# Patient Record
Sex: Female | Born: 2000 | Race: White | Hispanic: No | Marital: Single | Smoking: Never smoker
Health system: Southern US, Community
[De-identification: ages and names within clinical notes are randomized; demographics above are authoritative.]

---

## 2020-03-27 ENCOUNTER — Encounter (HOSPITAL_COMMUNITY): Payer: Self-pay

## 2020-03-27 ENCOUNTER — Other Ambulatory Visit: Payer: Self-pay

## 2020-03-27 ENCOUNTER — Emergency Department (HOSPITAL_COMMUNITY)
Admission: EM | Admit: 2020-03-27 | Discharge: 2020-03-28 | Disposition: A | Discharge status: Home / Self Care | Payer: BC Managed Care – PPO | Attending: Emergency Medicine | Admitting: Emergency Medicine

## 2020-03-27 ENCOUNTER — Emergency Department (HOSPITAL_COMMUNITY): Payer: BC Managed Care – PPO

## 2020-03-27 DIAGNOSIS — R000 Tachycardia, unspecified: Secondary | ICD-10-CM

## 2020-03-27 DIAGNOSIS — R110 Nausea: Secondary | ICD-10-CM | POA: Insufficient documentation

## 2020-03-27 DIAGNOSIS — R0602 Shortness of breath: Secondary | ICD-10-CM | POA: Diagnosis not present

## 2020-03-27 DIAGNOSIS — R079 Chest pain, unspecified: Secondary | ICD-10-CM | POA: Diagnosis not present

## 2020-03-27 LAB — I-STAT BETA HCG BLOOD, ED (MC, WL, AP ONLY): I-stat hCG, quantitative: <5 m[IU]/mL (ref <5)

## 2020-03-27 LAB — BASIC METABOLIC PANEL
Anion gap: 13 (ref 5–15)
BUN: 9 mg/dL (ref 6–20)
CO2: 23 mmol/L (ref 22–32)
Calcium: 9.7 mg/dL (ref 8.9–10.3)
Chloride: 101 mmol/L (ref 98–111)
Creatinine, Ser: 0.74 mg/dL (ref 0.44–1.00)
GFR, Estimated: >60 mL/min (ref >60)
Glucose, Bld: 118 mg/dL — ABNORMAL HIGH (ref 70–99)
Potassium: 3.8 mmol/L (ref 3.5–5.1)
Sodium: 137 mmol/L (ref 135–145)

## 2020-03-27 LAB — CBC
HCT: 42.6 % (ref 36.0–46.0)
Hemoglobin: 13.9 g/dL (ref 12.0–15.0)
MCH: 28.3 pg (ref 26.0–34.0)
MCHC: 32.6 g/dL (ref 30.0–36.0)
MCV: 86.8 fL (ref 80.0–100.0)
Platelets: 410 10*3/uL — ABNORMAL HIGH (ref 150–400)
RBC: 4.91 MIL/uL (ref 3.87–5.11)
RDW: 13.2 % (ref 11.5–15.5)
WBC: 9.3 10*3/uL (ref 4.0–10.5)
nRBC: 0 % (ref 0.0–0.2)

## 2020-03-27 LAB — TROPONIN I (HIGH SENSITIVITY): Troponin I (High Sensitivity): 3 ng/L (ref <18)

## 2020-03-27 NOTE — ED Triage Notes (Signed)
Patient with sudden onset upper chest pain now radiating into her neck, patient reports racing heart feeling and ringing in her ears

## 2020-03-28 LAB — TROPONIN I (HIGH SENSITIVITY): Troponin I (High Sensitivity): 3 ng/L (ref <18)

## 2020-03-28 LAB — D-DIMER, QUANTITATIVE: D-Dimer, Quant: <0.27 ug{FEU}/mL (ref 0.00–0.50)

## 2020-03-28 NOTE — ED Provider Notes (Signed)
[facility] EMERGENCY DEPARTMENT Provider Note  CSN: [id number] Arrival date & time: 03/27/20 2209  Chief Complaint(s) Chest Pain  HPI Jordan Owens is a 20 y.o. female here with sudden onset left upper chest pain that radiates to her neck. Sudden onset and severe.  Now resolved. Nonradiating and nonexertional. Associated with shortness of breath, nausea and left facial numbness as well as ringing in the ear. Patient also noted heart racing heart rate. Reports prior history of panic attacks but states this is completely different. Her symptoms lasted approximately 1-1/2 hours and resolved after arriving here.  Now completely asymptomatic. She denies any recent fevers or infections. No coughing or congestion. She denies any illicit drug use or alcohol use. No energy drinks. Patient does take estrogen-based OCPs. No prior history of blood clots or DVT.  HPI  Past Medical History History reviewed. No pertinent past medical history. There are no problems to display for this patient.  Home Medication(s) Prior to Admission medications   Medication Sig Start Date End Date Taking? Authorizing Provider  TRI-SPRINTEC 0.18/0.215/0.25 MG-35 MCG tablet Take 1 tablet by mouth daily. 01/23/20  Yes [provider]                                                                                                                                    Past Surgical History History reviewed. No pertinent surgical history. Family History History reviewed. No pertinent family history.  Social History Social History   Tobacco Use  . Smoking status: Never Smoker  . Smokeless tobacco: Never Used  Substance Use Topics  . Alcohol use: Yes  . Drug use: Never   Allergies Patient has no known allergies.  Review of Systems Review of Systems All other systems are reviewed and are negative for acute change except as noted in the HPI  Physical Exam Vital Signs  I  have reviewed the triage vital signs BP 124/79   Pulse 83   Temp 98.2 F (36.8 C) (Oral)   Resp 18   Ht 5\' 6"  (1.676 m)   Wt 63.5 kg   LMP 03/24/2020   SpO2 98%   BMI 22.60 kg/m   Physical Exam Vitals reviewed.  Constitutional:      General: She is not in acute distress.    Appearance: She is well-developed and well-nourished. She is not diaphoretic.  HENT:     Head: Normocephalic and atraumatic.     Nose: Nose normal.  Eyes:     General: No scleral icterus.       Right eye: No discharge.        Left eye: No discharge.     Extraocular Movements: EOM normal.     Conjunctiva/sclera: Conjunctivae normal.     Pupils: Pupils are equal, round, and reactive to light.  Cardiovascular:     Rate and Rhythm: Normal rate and regular rhythm.     Heart  sounds: No murmur heard. No friction rub. No gallop.   Pulmonary:     Effort: Pulmonary effort is normal. No respiratory distress.     Breath sounds: Normal breath sounds. No stridor. No rales.  Abdominal:     General: There is no distension.     Palpations: Abdomen is soft.     Tenderness: There is no abdominal tenderness.  Musculoskeletal:        General: No tenderness or edema.     Cervical back: Normal range of motion and neck supple. No muscular tenderness.  Skin:    General: Skin is warm and dry.     Findings: No erythema or rash.  Neurological:     Mental Status: She is alert and oriented to person, place, and time.  Psychiatric:        Mood and Affect: Mood and affect normal.     ED Results and Treatments Labs (all labs ordered are listed, but only abnormal results are displayed) Labs Reviewed  BASIC METABOLIC PANEL - Abnormal; Notable for the following components:      Result Value   Glucose, Bld 118 (*)    All other components within normal limits  CBC - Abnormal; Notable for the following components:   Platelets 410 (*)    All other components within normal limits  D-DIMER, QUANTITATIVE  I-STAT BETA HCG  BLOOD, ED ([facility], [facility], [facility] ONLY)  TROPONIN I (HIGH SENSITIVITY)  TROPONIN I (HIGH SENSITIVITY)                                                                                                                         EKG  EKG Interpretation  Date/Time:  Wednesday March 27 2020 22:20:40 EST Ventricular Rate:  147 PR Interval:    QRS Duration: 72 QT Interval:  338 QTC Calculation: 528 R Axis:   94 Text Interpretation: Sinus tachycardia with short PR  vs fluttler vs SVT Rightward axis ST & T wave abnormality, consider inferolateral ischemia Abnormal ECG No old tracing to compare Reconfirmed by Drema Pry [phone]) on 03/28/2020 3:50:07 AM       EKG Interpretation  Date/Time:  Thursday March 28 2020 03:17:11 EST Ventricular Rate:  62 PR Interval:    QRS Duration: 84 QT Interval:  426 QTC Calculation: 433 R Axis:   84 Text Interpretation: Sinus rhythm ST elev, probable normal early repol pattern Since last tracing rate slower Confirmed by Drema Pry [phone]) on 03/28/2020 3:50:48 AM       Radiology DG Chest 2 View  Result Date: 03/27/2020 CLINICAL DATA:  Sudden onset of upper chest pain radiating to the neck. EXAM: CHEST - 2 VIEW COMPARISON:  None. FINDINGS: Heart size is normal. Mediastinal shadows are normal. The lungs are clear. No bronchial thickening. No infiltrate, mass, effusion or collapse. Pulmonary vascularity is normal. No bony abnormality. IMPRESSION: Normal chest. Electronically Signed   By: Paulina Fusi M.D.   On: 03/27/2020 22:51    Pertinent labs &  imaging results that were available during my care of the patient were reviewed by me and considered in my medical decision making (see chart for details).  Medications Ordered in ED Medications - No data to display                                                                                                                                  Procedures Procedures  (including critical care time)  Medical Decision  Making / ED Course I have reviewed the nursing notes for this encounter and the patient's prior records (if available in EHR or on provided paperwork).   Jordan Owens was evaluated in Emergency Department on 03/28/2020 for the symptoms described in the history of present illness. She was evaluated in the context of the global COVID-19 pandemic, which necessitated consideration that the patient might be at risk for infection with the SARS-CoV-2 virus that causes COVID-19. Institutional protocols and algorithms that pertain to the evaluation of patients at risk for COVID-19 are in a state of rapid change based on information released by regulatory bodies including the CDC and federal and state organizations. These policies and algorithms were followed during the patient's care in the ED.  Atypical chest pain. Initial EKG with tachycardia, sinus vs, flutter vs SVT. Serial troponins negative.  Doubt ACS.  Patient is on OCPs and had tachycardia.  Possible PE, low to intermediate probability.  Dimer negative.  Given the fact that her tachycardia resolved she is asymptomatic this is highly unlikely.  Possible SVT. Now resolved. No significant electrolyte derangements or renal sufficiency. No anemia.  Chest x-ray without evidence suggestive of pneumonia, pneumothorax, pneumomediastinum.  No abnormal contour of the mediastinum to suggest dissection. No evidence of acute injuries. Low suspicion for dissection.      Final Clinical Impression(s) / ED Diagnoses Final diagnoses:  Left-sided chest pain  Tachycardia  SOB (shortness of breath)   The patient appears reasonably screened and/or stabilized for discharge and I doubt any other medical condition or other [facility] requiring further screening, evaluation, or treatment in the ED at this time prior to discharge. Safe for discharge with strict return precautions.  Disposition: Discharge  Condition: Good  I have discussed the results, Dx and Tx  plan with the patient/family who expressed understanding and agree(s) with the plan. Discharge instructions discussed at length. The patient/family was given strict return precautions who verbalized understanding of the instructions. No further questions at time of discharge.    ED Discharge Orders    None       Follow Up: Paula Libra, MD [address] [facility] [address] [place] [address] [phone]  Call  to schedule an appointment for close follow up      This chart was dictated using voice recognition software.  Despite best efforts to proofread,  errors can occur which can change the documentation meaning.   Nira Conn, MD 03/28/20 [phone]

## 2021-08-10 IMAGING — CR DG CHEST 2V
2 series · 2 of 2 positions shown · non-contrast
Comparison: None.

CLINICAL DATA: Sudden onset of upper chest pain radiating to the
neck.

EXAM:
CHEST - 2 VIEW

[chest pa]
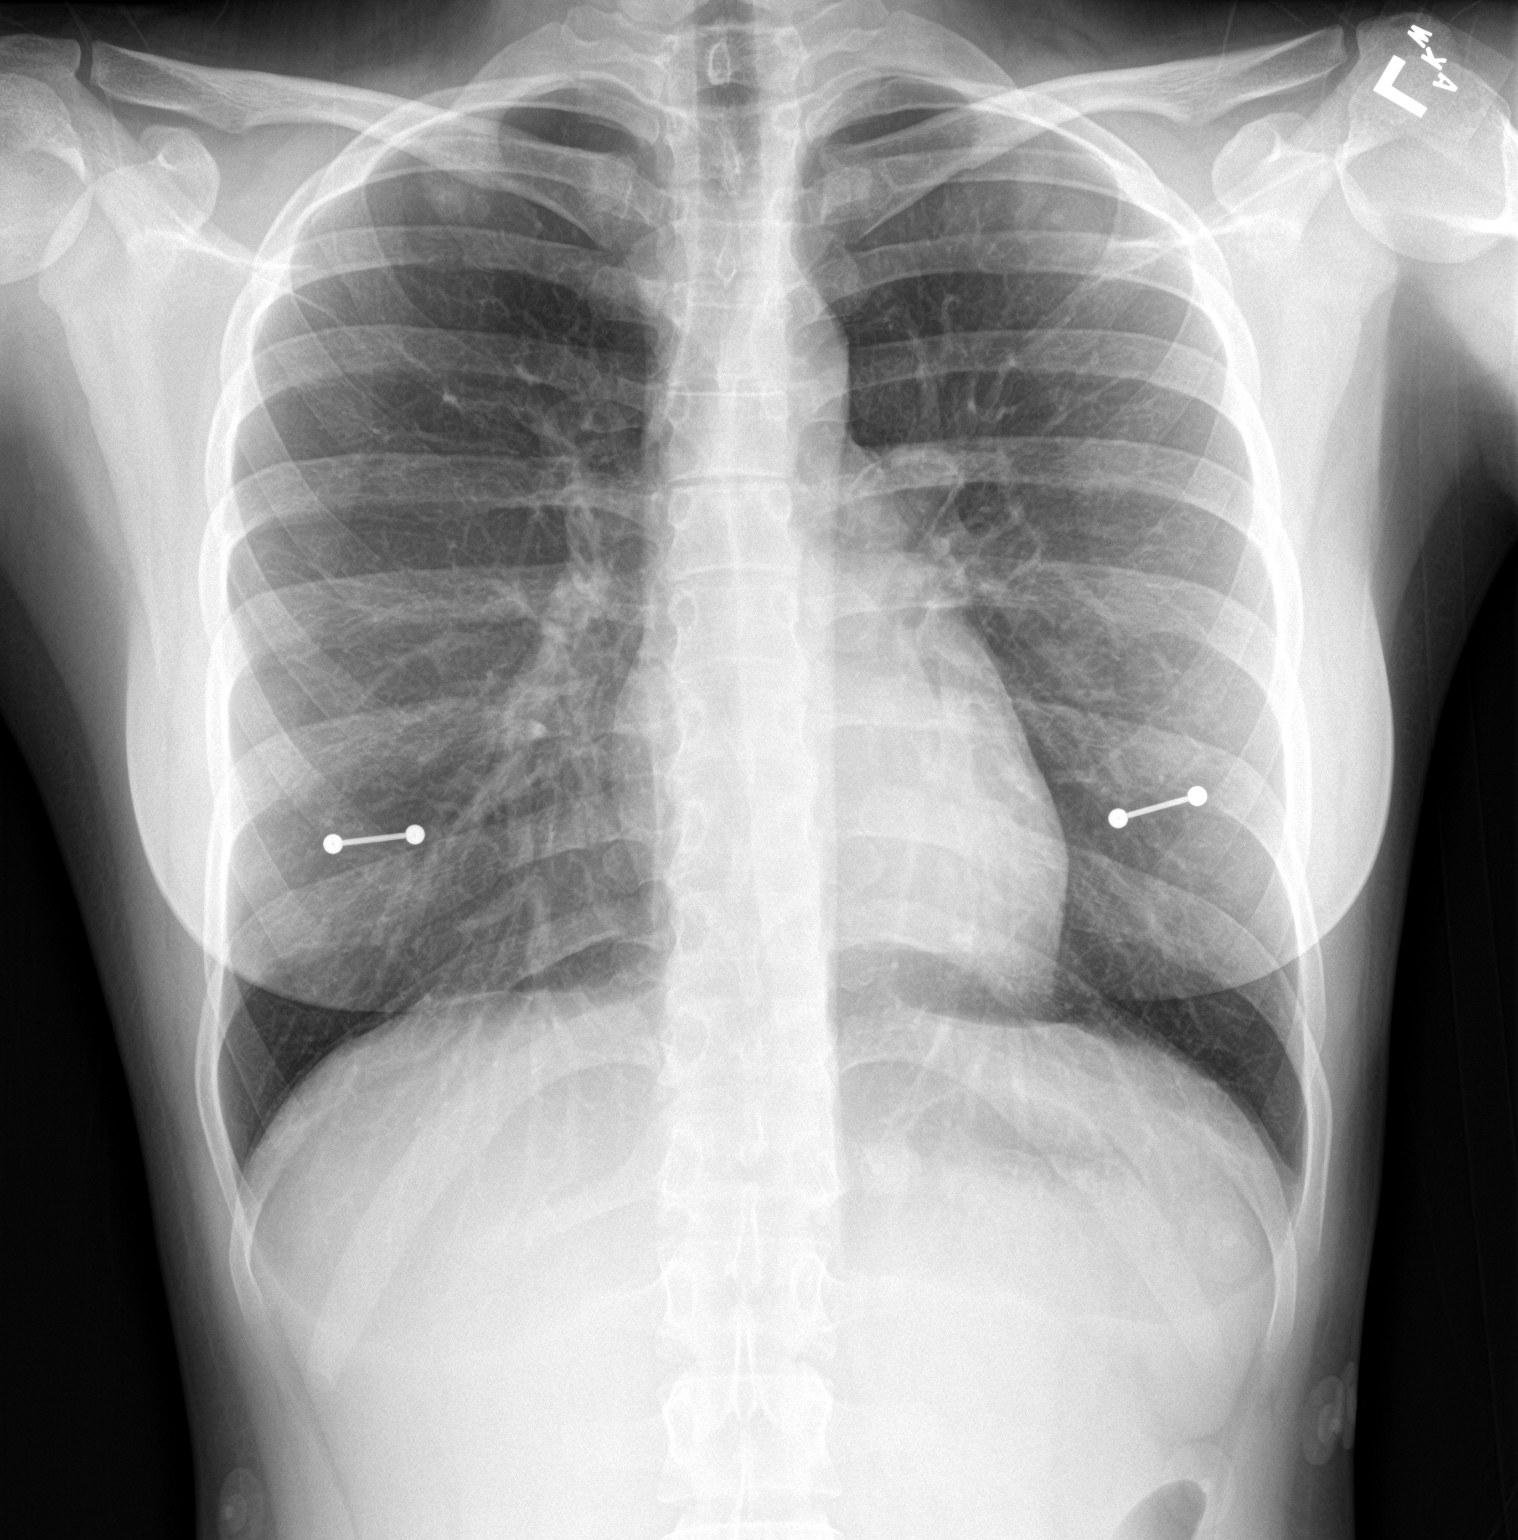

[chest lat]
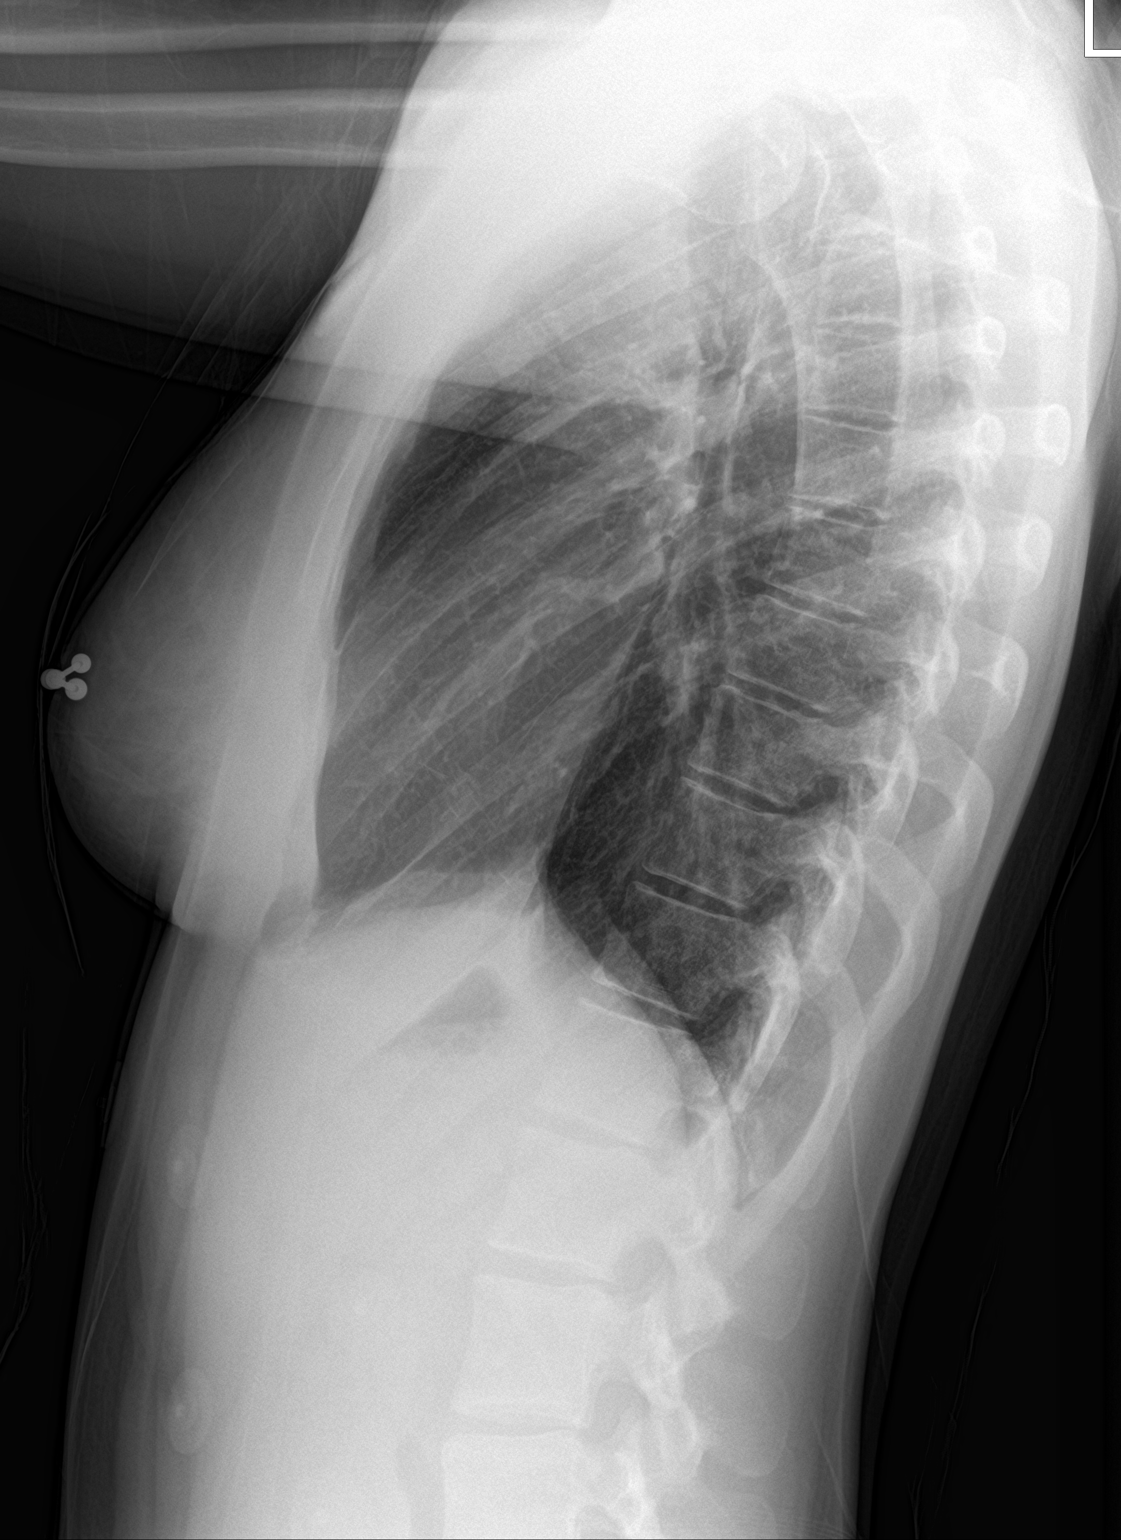

[2 of 2 positions shown; findings below may reference images not displayed]

FINDINGS: Heart size is normal. Mediastinal shadows are normal. The lungs are
clear. No bronchial thickening. No infiltrate, mass, effusion or
collapse. Pulmonary vascularity is normal. No bony abnormality.
IMPRESSION: Normal chest.
